# Patient Record
Sex: Female | Born: 2000 | Race: White | Hispanic: Yes | Marital: Single | Smoking: Never smoker
Health system: Southern US, Community
[De-identification: ages and names within clinical notes are randomized; demographics above are authoritative.]

---
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
## 2019-04-22 ENCOUNTER — Emergency Department
Admission: EM | Admit: 2019-04-22 | Discharge: 2019-04-23 | Disposition: A | Discharge status: Home / Self Care | Payer: Medicaid Other | Attending: Emergency Medicine | Admitting: Emergency Medicine

## 2019-04-22 ENCOUNTER — Other Ambulatory Visit: Payer: Self-pay

## 2019-04-22 ENCOUNTER — Emergency Department: Payer: Medicaid Other

## 2019-04-22 DIAGNOSIS — R1011 Right upper quadrant pain: Secondary | ICD-10-CM | POA: Diagnosis present

## 2019-04-22 DIAGNOSIS — R1013 Epigastric pain: Secondary | ICD-10-CM | POA: Insufficient documentation

## 2019-04-22 LAB — URINALYSIS, COMPLETE (UACMP) WITH MICROSCOPIC
Bilirubin Urine: NEGATIVE
Glucose, UA: NEGATIVE mg/dL
Hgb urine dipstick: NEGATIVE
Ketones, ur: NEGATIVE mg/dL
Leukocytes,Ua: NEGATIVE
Nitrite: NEGATIVE
Protein, ur: NEGATIVE mg/dL
Specific Gravity, Urine: 1.019 (ref 1.005–1.030)
WBC, UA: NONE SEEN WBC/hpf (ref 0–5)
pH: 6 (ref 5.0–8.0)

## 2019-04-22 LAB — COMPREHENSIVE METABOLIC PANEL
ALT: 19 U/L (ref 0–44)
AST: 13 U/L — ABNORMAL LOW (ref 15–41)
Albumin: 4.2 g/dL (ref 3.5–5.0)
Alkaline Phosphatase: 90 U/L (ref 38–126)
Anion gap: 5 (ref 5–15)
BUN: 8 mg/dL (ref 6–20)
CO2: 27 mmol/L (ref 22–32)
Calcium: 8.9 mg/dL (ref 8.9–10.3)
Chloride: 105 mmol/L (ref 98–111)
Creatinine, Ser: 0.53 mg/dL (ref 0.44–1.00)
GFR calc Af Amer: >60 mL/min (ref >60)
GFR calc non Af Amer: >60 mL/min (ref >60)
Glucose, Bld: 91 mg/dL (ref 70–99)
Potassium: 4.4 mmol/L (ref 3.5–5.1)
Sodium: 137 mmol/L (ref 135–145)
Total Bilirubin: 0.5 mg/dL (ref 0.3–1.2)
Total Protein: 7.2 g/dL (ref 6.5–8.1)

## 2019-04-22 LAB — CBC
HCT: 38.4 % (ref 36.0–46.0)
Hemoglobin: 12.3 g/dL (ref 12.0–15.0)
MCH: 29.1 pg (ref 26.0–34.0)
MCHC: 32 g/dL (ref 30.0–36.0)
MCV: 91 fL (ref 80.0–100.0)
Platelets: 291 10*3/uL (ref 150–400)
RBC: 4.22 MIL/uL (ref 3.87–5.11)
RDW: 12 % (ref 11.5–15.5)
WBC: 11.8 10*3/uL — ABNORMAL HIGH (ref 4.0–10.5)
nRBC: 0 % (ref 0.0–0.2)

## 2019-04-22 LAB — LIPASE, BLOOD: Lipase: 22 U/L (ref 11–51)

## 2019-04-22 LAB — POCT PREGNANCY, URINE: Preg Test, Ur: NEGATIVE

## 2019-04-22 MED ORDER — LIDOCAINE VISCOUS HCL 2 % MT SOLN
15.0000 mL | Freq: Once | OROMUCOSAL | Status: AC
  Administered 2019-04-22: 23:00:00 15 mL via ORAL
  Filled 2019-04-22: qty 15

## 2019-04-22 MED ORDER — ALUM & MAG HYDROXIDE-SIMETH 200-200-20 MG/5ML PO SUSP
30.0000 mL | Freq: Once | ORAL | Status: AC
  Administered 2019-04-22: 30 mL via ORAL
  Filled 2019-04-22: qty 30

## 2019-04-22 NOTE — ED Notes (Signed)
Patient reports epigastric pain with and increase when she eats. Patient reports pain in the area that is intermittent. Patient states she has experienced fever yesterday and has had diarrhea.

## 2019-04-22 NOTE — ED Triage Notes (Signed)
Pt in with co epigastric pain x 3 days, hx of reflux but states does not feel the same. Pt has had n.v.d since yesterday.

## 2019-04-22 NOTE — ED Provider Notes (Signed)
[facility] Emergency Department Provider Note  ____________________________________________   First MD Initiated Contact with Patient 04/22/19 2307     (approximate)  I have reviewed the triage vital signs and the nursing notes.   HISTORY  Chief Complaint Abdominal Pain   HPI Claire Schmitt is a 19 y.o. female with no known chronic medical conditions no previous surgeries presents to the emergency department secondary to current 6 out of 10 cramping epigastric pain x3 days.  Patient does admit that the pain is worse with food.  Patient does admit to nausea and 1 episode of vomiting yesterday.  Patient denies any diarrhea.        No past medical history on file.  Patient Active Problem List   Diagnosis Date Noted  . Migraine with aura and without status migrainosus, not intractable 06/07/2014  . Episodic tension type headache 06/07/2014  . Acanthosis nigricans 06/07/2014    No past surgical history on file.  Prior to Admission medications   Medication Sig Start Date End Date Taking? Authorizing Provider  pantoprazole (PROTONIX) 40 MG tablet Take 1 tablet (40 mg total) by mouth daily. 04/23/19 05/23/19  Darci Current, MD    Allergies Patient has no known allergies.  No family history on file.  Social History Social History   Tobacco Use  . Smoking status: Never Smoker  Substance Use Topics  . Alcohol use: No    Alcohol/week: 0.0 standard drinks  . Drug use: No    Review of Systems Constitutional: No fever/chills Eyes: No visual changes. ENT: No sore throat. Cardiovascular: Denies chest pain. Respiratory: Denies shortness of breath. Gastrointestinal: Positive for abdominal pain, nausea and vomiting no diarrhea.  No constipation. Genitourinary: Negative for dysuria. Musculoskeletal: Negative for neck pain.  Negative for back pain. Integumentary: Negative for rash. Neurological: Negative for headaches, focal weakness or  numbness.  ____________________________________________   PHYSICAL EXAM:  VITAL SIGNS: ED Triage Vitals  Enc Vitals Group     BP 04/22/19 2200 119/71     Pulse Rate 04/22/19 2200 79     Resp 04/22/19 2200 20     Temp 04/22/19 2200 98.3 F (36.8 C)     Temp Source 04/22/19 2200 Oral     SpO2 04/22/19 2200 100 %     Weight 04/22/19 2201 99.8 kg (220 lb)     Height 04/22/19 2201 1.626 m (5\' 4" )     Head Circumference --      Peak Flow --      Pain Score 04/22/19 2201 8     Pain Loc --      Pain Edu? --      Excl. in GC? --     Constitutional: Alert and oriented.  Eyes: Conjunctivae are normal.  Mouth/Throat: Patient is wearing a mask. Neck: No stridor.  No meningeal signs.   Cardiovascular: Normal rate, regular rhythm. Good peripheral circulation. Grossly normal heart sounds. Respiratory: Normal respiratory effort.  No retractions. Gastrointestinal: Soft and nontender. No distention.  Musculoskeletal: No lower extremity tenderness nor edema. No gross deformities of extremities. Neurologic:  Normal speech and language. No gross focal neurologic deficits are appreciated.  Skin:  Skin is warm, dry and intact. Psychiatric: Mood and affect are normal. Speech and behavior are normal. ____________________________________________   LABS (all labs ordered are listed, but only abnormal results are displayed)  Labs Reviewed  CBC - Abnormal; Notable for the following components:      Result Value   WBC  11.8 (*)    All other components within normal limits  COMPREHENSIVE METABOLIC PANEL - Abnormal; Notable for the following components:   AST 13 (*)    All other components within normal limits  URINALYSIS, COMPLETE (UACMP) WITH MICROSCOPIC - Abnormal; Notable for the following components:   Color, Urine YELLOW (*)    APPearance HAZY (*)    Bacteria, UA RARE (*)    All other components within normal limits  LIPASE, BLOOD  POC URINE PREG, ED  POCT PREGNANCY, URINE      RADIOLOGY I, [place] Claire Schmitt, personally viewed and evaluated these images (plain radiographs) as part of my medical decision making, as well as reviewing the written report by the radiologist.  ED MD interpretation:   Unremarkable right upper quadrant ultrasound per radiologist  Official radiology report(s): US ABDOMEN LIMITED RUQ  Result Date: 04/23/2019 CLINICAL DATA:  Right upper quadrant pain for 3 days EXAM: ULTRASOUND ABDOMEN LIMITED RIGHT UPPER QUADRANT COMPARISON:  None. FINDINGS: Gallbladder: No gallstones or wall thickening visualized. No sonographic Murphy sign noted by sonographer. Common bile duct: Diameter: 4 mm Liver: No focal lesion identified. Within normal limits in parenchymal echogenicity. Portal vein is patent on color Doppler imaging with normal direction of blood flow towards the liver. Other: None. IMPRESSION: 1. Unremarkable right upper quadrant ultrasound. Electronically Signed   By: Randa Ngo M.D.   On: 04/23/2019 00:24     Procedures   ____________________________________________   INITIAL IMPRESSION / MDM / [place] / ED COURSE  As part of my medical decision making, I reviewed the following data within the electronic MEDICAL RECORD NUMBER  19 year old female presented with above-stated history and physical exam a differential diagnosis including but not limited to peptic ulcer gastritis pancreatitis cholelithiasis versus other potential gallbladder pathology.  Right upper quadrant ultrasound performed normal.  Laboratory data unremarkable.  Patient given a GI cocktail with improvement of symptoms.  Patient be prescribed Protonix for home with recommendation to follow-up with gastroenterology Dr. Marius Ditch ____________________________________________  FINAL CLINICAL IMPRESSION(S) / ED DIAGNOSES  Final diagnoses:  RUQ pain  Epigastric pain     MEDICATIONS GIVEN DURING THIS VISIT:  Medications  alum & mag hydroxide-simeth (MAALOX/MYLANTA)  200-200-20 MG/5ML suspension 30 mL (30 mLs Oral Given 04/22/19 2327)    And  lidocaine (XYLOCAINE) 2 % viscous mouth solution 15 mL (15 mLs Oral Given 04/22/19 2327)     ED Discharge Orders         Ordered    pantoprazole (PROTONIX) 40 MG tablet  Daily     04/23/19 0023          *Please note:  Yancy Hascall was evaluated in Emergency Department on 04/23/2019 for the symptoms described in the history of present illness. She was evaluated in the context of the global COVID-19 pandemic, which necessitated consideration that the patient might be at risk for infection with the SARS-CoV-2 virus that causes COVID-19. Institutional protocols and algorithms that pertain to the evaluation of patients at risk for COVID-19 are in a state of rapid change based on information released by regulatory bodies including the CDC and federal and state organizations. These policies and algorithms were followed during the patient's care in the ED.  Some ED evaluations and interventions may be delayed as a result of limited staffing during the pandemic.*  Note:  This document was prepared using Dragon voice recognition software and may include unintentional dictation errors.   Gregor Hams, MD 04/23/19 [phone]

## 2019-04-23 MED ORDER — PANTOPRAZOLE SODIUM 40 MG PO TBEC: 40.0000 mg | DELAYED_RELEASE_TABLET | Freq: Every day | ORAL | 0 refills | Status: AC

## 2020-04-16 ENCOUNTER — Emergency Department: Payer: Medicaid Other

## 2020-04-16 ENCOUNTER — Other Ambulatory Visit: Payer: Self-pay

## 2020-04-16 ENCOUNTER — Inpatient Hospital Stay
Admission: EM | Admit: 2020-04-16 | Discharge: 2020-04-18 | DRG: 439 | Disposition: A | Discharge status: Home / Self Care | Payer: Medicaid Other | Attending: Internal Medicine | Admitting: Internal Medicine

## 2020-04-16 ENCOUNTER — Encounter: Payer: Self-pay | Admitting: Internal Medicine

## 2020-04-16 DIAGNOSIS — Z6841 Body Mass Index (BMI) 40.0 and over, adult: Secondary | ICD-10-CM | POA: Diagnosis not present

## 2020-04-16 DIAGNOSIS — Z8249 Family history of ischemic heart disease and other diseases of the circulatory system: Secondary | ICD-10-CM

## 2020-04-16 DIAGNOSIS — E66813 Obesity, class 3: Secondary | ICD-10-CM | POA: Diagnosis present

## 2020-04-16 DIAGNOSIS — Z20822 Contact with and (suspected) exposure to covid-19: Secondary | ICD-10-CM | POA: Diagnosis present

## 2020-04-16 DIAGNOSIS — K859 Acute pancreatitis without necrosis or infection, unspecified: Principal | ICD-10-CM | POA: Diagnosis present

## 2020-04-16 LAB — URINALYSIS, ROUTINE W REFLEX MICROSCOPIC
Bacteria, UA: NONE SEEN
Bilirubin Urine: NEGATIVE
Glucose, UA: NEGATIVE mg/dL
Ketones, ur: NEGATIVE mg/dL
Leukocytes,Ua: NEGATIVE
Nitrite: NEGATIVE
Protein, ur: NEGATIVE mg/dL
Specific Gravity, Urine: 1.02 (ref 1.005–1.030)
pH: 6 (ref 5.0–8.0)

## 2020-04-16 LAB — CBC
HCT: 40.6 % (ref 36.0–46.0)
Hemoglobin: 13.3 g/dL (ref 12.0–15.0)
MCH: 28.2 pg (ref 26.0–34.0)
MCHC: 32.8 g/dL (ref 30.0–36.0)
MCV: 86 fL (ref 80.0–100.0)
Platelets: 317 10*3/uL (ref 150–400)
RBC: 4.72 MIL/uL (ref 3.87–5.11)
RDW: 14 % (ref 11.5–15.5)
WBC: 10.1 10*3/uL (ref 4.0–10.5)
nRBC: 0 % (ref 0.0–0.2)

## 2020-04-16 LAB — BASIC METABOLIC PANEL
Anion gap: 9 (ref 5–15)
BUN: 16 mg/dL (ref 6–20)
CO2: 23 mmol/L (ref 22–32)
Calcium: 9 mg/dL (ref 8.9–10.3)
Chloride: 106 mmol/L (ref 98–111)
Creatinine, Ser: 0.63 mg/dL (ref 0.44–1.00)
GFR, Estimated: >60 mL/min (ref >60)
Glucose, Bld: 113 mg/dL — ABNORMAL HIGH (ref 70–99)
Potassium: 3.7 mmol/L (ref 3.5–5.1)
Sodium: 138 mmol/L (ref 135–145)

## 2020-04-16 LAB — LIPID PANEL
Cholesterol: 150 mg/dL (ref 0–200)
HDL: 35 mg/dL — ABNORMAL LOW (ref >40)
LDL Cholesterol: 101 mg/dL — ABNORMAL HIGH (ref 0–99)
Total CHOL/HDL Ratio: 4.3 RATIO
Triglycerides: 68 mg/dL (ref <150)
VLDL: 14 mg/dL (ref 0–40)

## 2020-04-16 LAB — TROPONIN I (HIGH SENSITIVITY): Troponin I (High Sensitivity): <2 ng/L (ref <18)

## 2020-04-16 LAB — HIV ANTIBODY (ROUTINE TESTING W REFLEX): HIV Screen 4th Generation wRfx: NONREACTIVE

## 2020-04-16 LAB — HEPATIC FUNCTION PANEL
ALT: 34 U/L (ref 0–44)
AST: 27 U/L (ref 15–41)
Albumin: 4.2 g/dL (ref 3.5–5.0)
Alkaline Phosphatase: 85 U/L (ref 38–126)
Bilirubin, Direct: 0.1 mg/dL (ref 0.0–0.2)
Indirect Bilirubin: 0.4 mg/dL (ref 0.3–0.9)
Total Bilirubin: 0.5 mg/dL (ref 0.3–1.2)
Total Protein: 7.2 g/dL (ref 6.5–8.1)

## 2020-04-16 LAB — POC URINE PREG, ED: Preg Test, Ur: NEGATIVE

## 2020-04-16 LAB — SARS CORONAVIRUS 2 (TAT 6-24 HRS): SARS Coronavirus 2: NEGATIVE

## 2020-04-16 LAB — LIPASE, BLOOD: Lipase: 689 U/L — ABNORMAL HIGH (ref 11–51)

## 2020-04-16 MED ORDER — SODIUM CHLORIDE 0.9 % IV SOLN: INTRAVENOUS | Status: DC

## 2020-04-16 MED ORDER — LACTATED RINGERS IV BOLUS
1000.0000 mL | Freq: Once | INTRAVENOUS | Status: AC
  Administered 2020-04-16: 1000 mL via INTRAVENOUS

## 2020-04-16 MED ORDER — IOHEXOL 300 MG/ML  SOLN
100.0000 mL | Freq: Once | INTRAMUSCULAR | Status: AC | PRN
  Administered 2020-04-16: 100 mL via INTRAVENOUS

## 2020-04-16 MED ORDER — ONDANSETRON HCL 4 MG/2ML IJ SOLN: 4.0000 mg | Freq: Four times a day (QID) | INTRAMUSCULAR | Status: DC | PRN

## 2020-04-16 MED ORDER — ONDANSETRON HCL 4 MG/2ML IJ SOLN
4.0000 mg | INTRAMUSCULAR | Status: AC
  Administered 2020-04-16: 4 mg via INTRAVENOUS
  Filled 2020-04-16: qty 2

## 2020-04-16 MED ORDER — ONDANSETRON HCL 4 MG PO TABS: 4.0000 mg | ORAL_TABLET | Freq: Four times a day (QID) | ORAL | Status: DC | PRN

## 2020-04-16 MED ORDER — MORPHINE SULFATE (PF) 2 MG/ML IV SOLN
2.0000 mg | INTRAVENOUS | Status: DC | PRN
Start: 2020-04-16 — End: 2020-04-16
  Administered 2020-04-16: 2 mg via INTRAVENOUS
  Filled 2020-04-16: qty 1

## 2020-04-16 MED ORDER — MORPHINE SULFATE (PF) 2 MG/ML IV SOLN
2.0000 mg | INTRAVENOUS | Status: DC | PRN
  Administered 2020-04-16 – 2020-04-18 (×7): 2 mg via INTRAVENOUS
  Filled 2020-04-16 (×7): qty 1

## 2020-04-16 MED ORDER — PANTOPRAZOLE SODIUM 40 MG IV SOLR
40.0000 mg | INTRAVENOUS | Status: DC
  Administered 2020-04-16 – 2020-04-18 (×3): 40 mg via INTRAVENOUS
  Filled 2020-04-16 (×3): qty 40

## 2020-04-16 MED ORDER — KETOROLAC TROMETHAMINE 30 MG/ML IJ SOLN
15.0000 mg | Freq: Once | INTRAMUSCULAR | Status: AC
  Administered 2020-04-16: 15 mg via INTRAVENOUS
  Filled 2020-04-16: qty 1

## 2020-04-16 MED ORDER — MORPHINE SULFATE (PF) 4 MG/ML IV SOLN
4.0000 mg | Freq: Once | INTRAVENOUS | Status: AC
  Administered 2020-04-16: 4 mg via INTRAVENOUS
  Filled 2020-04-16: qty 1

## 2020-04-16 MED ORDER — MORPHINE SULFATE (PF) 2 MG/ML IV SOLN
2.0000 mg | Freq: Once | INTRAVENOUS | Status: AC
  Administered 2020-04-16: 2 mg via INTRAVENOUS
  Filled 2020-04-16: qty 1

## 2020-04-16 MED ORDER — ENOXAPARIN SODIUM 40 MG/0.4ML ~~LOC~~ SOLN
40.0000 mg | SUBCUTANEOUS | Status: DC
  Administered 2020-04-16 – 2020-04-17 (×3): 40 mg via SUBCUTANEOUS
  Filled 2020-04-16 (×3): qty 0.4

## 2020-04-16 NOTE — ED Provider Notes (Signed)
[facility] Emergency Department Provider Note  ____________________________________________   Event Date/Time   First MD Initiated Contact with Patient 04/16/20 [phone]     (approximate)  I have reviewed the triage vital signs and the nursing notes.   HISTORY  Chief Complaint Abdominal Pain    HPI Claire Schmitt is a 20 y.o. female with no contributory past medical history who presents for evaluation of cute onset and severe  pain in her upper abdomen that radiates through to the middle of her back. It started last night and has been going on for hours. She has had nausea and vomiting as well. Trying to eat or drink anything makes it worse. She has had a similar issue once or twice in the past but it was much more mild and tonight is severe. She has not had any lower abdominal pain. She denies fever, sore throat, chest pain, shortness of breath.  The patient denies regular alcohol use and has never had her cholesterol or triglycerides checked. No new medications. No known history of gallbladder disease but she has not had a cholecystectomy.        No past medical history on file.  Patient Active Problem List   Diagnosis Date Noted  . Acute pancreatitis 04/16/2020  . Migraine with aura and without status migrainosus, not intractable 06/07/2014  . Episodic tension type headache 06/07/2014  . Acanthosis nigricans 06/07/2014    No past surgical history on file.  Prior to Admission medications   Medication Sig Start Date End Date Taking? Authorizing Provider  pantoprazole (PROTONIX) 40 MG tablet Take 1 tablet (40 mg total) by mouth daily. 04/23/19 05/23/19  Darci Current, MD    Allergies Patient has no known allergies.  No family history on file.  Social History Social History   Tobacco Use  . Smoking status: Never Smoker  Substance Use Topics  . Alcohol use: No    Alcohol/week: 0.0 standard drinks  . Drug use: No    Review of  Systems Constitutional: No fever/chills Eyes: No visual changes. ENT: No sore throat. Cardiovascular: Denies chest pain. Respiratory: Denies shortness of breath. Gastrointestinal: Upper abdominal pain associated with nausea and vomiting. Genitourinary: Negative for dysuria. Musculoskeletal: Negative for neck pain.  Negative for back pain. Integumentary: Negative for rash. Neurological: Negative for headaches, focal weakness or numbness.   ____________________________________________   PHYSICAL EXAM:  VITAL SIGNS: ED Triage Vitals [04/16/20 0357]  Enc Vitals Group     BP 130/74     Pulse Rate 77     Resp 16     Temp 97.8 F (36.6 C)     Temp Source Oral     SpO2 100 %     Weight 117.9 kg (260 lb)     Height 1.626 m (5\' 4" )     Head Circumference      Peak Flow      Pain Score 9     Pain Loc      Pain Edu?      Excl. in GC?     Constitutional: Alert and oriented. Appears uncomfortable Eyes: Conjunctivae are normal.  Head: Atraumatic. Nose: No congestion/rhinnorhea. Mouth/Throat: Patient is wearing a mask. Neck: No stridor.  No meningeal signs.   Cardiovascular: Normal rate, regular rhythm. Good peripheral circulation. Respiratory: Normal respiratory effort.  No retractions. Gastrointestinal: Obese. Severe tenderness palpation of the epigastrium and right upper quadrant with positive Murphy sign. No lower abdominal tenderness. Musculoskeletal: No lower extremity tenderness  nor edema. No gross deformities of extremities. Neurologic:  Normal speech and language. No gross focal neurologic deficits are appreciated.  Skin:  Skin is warm, dry and intact. Psychiatric: Mood and affect are normal. Speech and behavior are normal.  ____________________________________________   LABS (all labs ordered are listed, but only abnormal results are displayed)  Labs Reviewed  BASIC METABOLIC PANEL - Abnormal; Notable for the following components:      Result Value   Glucose, Bld  113 (*)    All other components within normal limits  LIPASE, BLOOD - Abnormal; Notable for the following components:   Lipase 689 (*)    All other components within normal limits  LIPID PANEL - Abnormal; Notable for the following components:   HDL 35 (*)    LDL Cholesterol 101 (*)    All other components within normal limits  URINALYSIS, ROUTINE W REFLEX MICROSCOPIC - Abnormal; Notable for the following components:   Color, Urine YELLOW (*)    APPearance HAZY (*)    Hgb urine dipstick SMALL (*)    All other components within normal limits  POC URINE PREG, ED - Normal  SARS CORONAVIRUS 2 (TAT 6-24 HRS)  CBC  HEPATIC FUNCTION PANEL  HIV ANTIBODY (ROUTINE TESTING W REFLEX)  TROPONIN I (HIGH SENSITIVITY)   ____________________________________________  EKG  ED ECG REPORT I, Loleta Roseory Forbach, the attending physician, personally viewed and interpreted this ECG.  Date: 04/16/2020 EKG Time: 3:50 AM Rate: 74 Rhythm: normal sinus rhythm QRS Axis: normal Intervals: normal ST/T Wave abnormalities: normal Narrative Interpretation: no evidence of acute ischemia ____________________________________________  RADIOLOGY I, Loleta Roseory Forbach, personally viewed and evaluated these images (plain radiographs) as part of my medical decision making, as well as reviewing the written report by the radiologist.  ED MD interpretation: Gallbladder sludge but no evidence of cholecystitis and no suggestion of choledocholithiasis on ultrasound. CT scan with IV contrast of the abdomen pelvis demonstrates uncomplicated pancreatitis.  Official radiology report(s): CT ABDOMEN PELVIS W CONTRAST  Result Date: 04/16/2020 CLINICAL DATA:  Epigastric pain. EXAM: CT ABDOMEN AND PELVIS WITH CONTRAST TECHNIQUE: Multidetector CT imaging of the abdomen and pelvis was performed using the standard protocol following bolus administration of intravenous contrast. CONTRAST:  100mL OMNIPAQUE IOHEXOL 300 MG/ML  SOLN COMPARISON:   Ultrasound from earlier today FINDINGS: Lower chest: No acute finding.  Motion artifact. Hepatobiliary: No focal liver abnormality.Preceding right upper quadrant ultrasound. Upper common bile duct dimensions are obscured by fat edema. At the level of the head the duct measures 5 mm. No calcified choledocholithiasis. Pancreas: Generalized expansion with septal and peripancreatic edema. No necrosis or collection. No visible vascular compromise. Spleen: Unremarkable. Adrenals/Urinary Tract: Negative adrenals. No hydronephrosis or stone. Unremarkable bladder. Stomach/Bowel:  No obstruction. No appendicitis. Vascular/Lymphatic: No acute vascular abnormality. No mass or adenopathy. Reproductive:No pathologic findings. Evidence of prior Cesarian section. Other: No ascites or pneumoperitoneum. Musculoskeletal: No acute abnormalities. IMPRESSION: Acute edematous pancreatitis without collection. Electronically Signed   By: Marnee SpringJonathon  Watts M.D.   On: 04/16/2020 06:53   US ABDOMEN LIMITED RUQ (LIVER/GB)  Result Date: 04/16/2020 CLINICAL DATA:  Epigastric pain EXAM: ULTRASOUND ABDOMEN LIMITED RIGHT UPPER QUADRANT COMPARISON:  None. FINDINGS: Gallbladder: Sludge noted layering within the fundal portion of the gallbladder. No gallstones or wall thickening visualized. No sonographic Murphy sign noted by sonographer. Common bile duct: Diameter: 6 mm Liver: Increased parenchymal echogenicity suggestive of hepatic steatosis. No focal lesion. Portal vein is patent on color Doppler imaging with normal direction of blood flow towards  the liver. Other: None. IMPRESSION: 1. No acute findings. 2. Gallbladder sludge. No gallstones, gallbladder wall thickening or sonographic Murphy's sign. 3. Common bile duct is upper limits of normal in caliber measuring 6 mm. Electronically Signed   By: Signa Kell M.D.   On: 04/16/2020 05:50    ____________________________________________   PROCEDURES   Procedure(s) performed (including  Critical Care):  Procedures   ____________________________________________   INITIAL IMPRESSION / MDM / ASSESSMENT AND PLAN / ED COURSE  As part of my medical decision making, I reviewed the following data within the electronic MEDICAL RECORD NUMBER Nursing notes reviewed and incorporated, Labs reviewed , EKG interpreted , Old chart reviewed, Discussed with admitting physician  and Notes from prior ED visits   Differential diagnosis includes, but is not limited to, biliary colic, pancreatitis, acid reflux.  The patient most likely suffering from biliary colic. Lab work is pending and abdominal ultrasound is pending. CBC is normal with no leukocytosis. Patient is afebrile and vital signs are stable. I ordered morphine 4 mg IV, Toradol 15 mg IV, and Zofran 4 mg IV for pain and nausea.       Clinical Course as of 04/16/20 9509  Wynelle Link Apr 16, 2020  0455 Lipase(!): 689 Consistent with acute pancreatitis, awaiting LFTs and U/S results. [CF]  [id number] Hepatic function panel Hepatic function panel is normal with no transaminitis. [CF]  0602 Right upper quadrant ultrasound demonstrates gallbladder sludge but no obvious or acute abnormalities such as cholecystitis.  I reassessed the patient and she is feeling better but still has abdominal pain.  She has no history of alcohol use and does not think she has ever been tested for cholesterol/triglycerides.  Given that this is somewhat of an unusual presentation with no clear cause, I am proceeding with CT scan of the abdomen pelvis with IV contrast.  She has implanted birth control but we will attempt to get a urinalysis and verify negative pregnancy test.  I ordered lactated Ringer's 1 L IV bolus and morphine 4 mg IV for her persistent pain.  She understands that she will likely be admitted to the hospital for further management and to allow her pancreas to "cool down" before she can proceed with outpatient, although if she is feeling significantly better and  the CT scan is reassuring she may try outpatient management.  I also added on a lipid panel. [CF]  [id number] Preg Test, Ur: Negative [CF]  0709 Patient said her pain has improved after morphine 4 mg IV.  However she is still severely tender to palpation upon abdominal reassessment.  Given her symptoms, her CT scan which shows acute edematous pancreatitis, and her degree of discomfort, I think it is unlikely she would be successful with outpatient management.  She agrees and would rather stay in the hospital.  I ordered a hospitalist consult. [CF]    Clinical Course User Index [CF] Loleta Rose, MD     ____________________________________________  FINAL CLINICAL IMPRESSION(S) / ED DIAGNOSES  Final diagnoses:  Acute pancreatitis, unspecified complication status, unspecified pancreatitis type     MEDICATIONS GIVEN DURING THIS VISIT:  Medications  enoxaparin (LOVENOX) injection 40 mg (has no administration in time range)  0.9 %  sodium chloride infusion ( Intravenous New Bag/Given 04/16/20 0750)  morphine 2 MG/ML injection 2 mg (has no administration in time range)  ondansetron (ZOFRAN) tablet 4 mg (has no administration in time range)    Or  ondansetron (ZOFRAN) injection 4 mg (has no administration in time range)  pantoprazole (PROTONIX) injection 40 mg (has no administration in time range)  ketorolac (TORADOL) 30 MG/ML injection 15 mg (15 mg Intravenous Given 04/16/20 0505)  ondansetron (ZOFRAN) injection 4 mg (4 mg Intravenous Given 04/16/20 0505)  lactated ringers bolus 1,000 mL (1,000 mLs Intravenous New Bag/Given 04/16/20 0618)  morphine 4 MG/ML injection 4 mg (4 mg Intravenous Given 04/16/20 0617)  iohexol (OMNIPAQUE) 300 MG/ML solution 100 mL (100 mLs Intravenous Contrast Given 04/16/20 7425)     ED Discharge Orders    None      *Please note:  Siah Kannan was evaluated in Emergency Department on 04/16/2020 for the symptoms described in the history of present illness. She was  evaluated in the context of the global COVID-19 pandemic, which necessitated consideration that the patient might be at risk for infection with the SARS-CoV-2 virus that causes COVID-19. Institutional protocols and algorithms that pertain to the evaluation of patients at risk for COVID-19 are in a state of rapid change based on information released by regulatory bodies including the CDC and federal and state organizations. These policies and algorithms were followed during the patient's care in the ED.  Some ED evaluations and interventions may be delayed as a result of limited staffing during and after the pandemic.*  Note:  This document was prepared using Dragon voice recognition software and may include unintentional dictation errors.   Loleta Rose, MD 04/16/20 [phone]

## 2020-04-16 NOTE — Plan of Care (Signed)

## 2020-04-16 NOTE — ED Notes (Signed)
LIPID PANEL BEING RAN BY LAB AT THIS TIME.

## 2020-04-16 NOTE — ED Notes (Signed)
ASSUMED CARE OF PT AT THIS TIME. NAD.  

## 2020-04-16 NOTE — ED Triage Notes (Signed)
Pt states upper abd pain radiating to central chest and mid back since 2200 last pm with vomiting. Pt denies diarrhea, shob or known fever. Pt appears uncomfortable.

## 2020-04-16 NOTE — H&P (Signed)
History and Physical    Claire Schmitt CBJ:[id number] DOB: 12/28/2000 DOA: 04/16/2020  PCP: [facility]   Patient coming from: Home  I have personally briefly reviewed patient's old medical records in [facility]  Chief Complaint: Abdominal pain  HPI: Claire Schmitt is a 20 y.o. female with no significant past medical history is also the ER for evaluation of sudden onset abdominal pain which started in the epigastrium and right upper quadrant with radiation to her back.  She rated her pain a 10 x 10 in intensity at its worst.  Pain was associated with nausea and vomiting and was aggravated by any form of oral intake.  Patient has had a similar issue in the past but not as severe as it was today. She denies having any fever or chills, no chest pain, no palpitations, no diaphoresis, no urinary frequency, no nocturia, no dysuria, no headache, no cough, no sore throat, no blurred vision, no weakness She does not have a known history of gallbladder disease and has not been on any new medications.  She denies history of alcohol use. Labs show sodium 138, potassium 3.7, chloride 106, bicarb 23, glucose 113, BUN 16, creatinine 0.63, calcium 9.0, alkaline phosphatase 85, albumin 4.2, lipase 689, AST 27, ALT 34, total protein 7.2, total cholesterol 158, HDL 35, LDL 101, triglycerides 68, white count 10.1, hemoglobin 13.3, hematocrit 40.6, MCV 86, RDW 14.0, platelet count 317 CT scan of abdomen and pelvis shows acute edematous pancreatitis without collection. Gallbladder ultrasound shows no acute findings. Gallbladder sludge. No gallstones, gallbladder wall thickening or sonographic Murphy's sign. Common bile duct is upper limits of normal in caliber measuring 6 mm.    ED Course: Patient is a 20 year old female who presents to the ER for evaluation of pain in the epigastrium and right upper quadrant associated with nausea and vomiting.  Labs show elevated lipase level and  imaging is consistent with acute pancreatitis.  Patient will be admitted to the hospital for further evaluation and treatment.  Review of Systems: As per HPI otherwise all other systems reviewed and negative.    No past medical history on file.  No past surgical history on file.   reports that she has never smoked. She does not have any smokeless tobacco history on file. She reports that she does not drink alcohol and does not use drugs.  No Known Allergies  Family History  Problem Relation Age of Onset  . Hypertension Mother       Prior to Admission medications   Medication Sig Start Date End Date Taking? Authorizing Provider  pantoprazole (PROTONIX) 40 MG tablet Take 1 tablet (40 mg total) by mouth daily. 04/23/19 05/23/19  Darci Current, MD    Physical Exam: Vitals:   04/16/20 0357 04/16/20 0631 04/16/20 0900  BP: 130/74 115/76 (!) 112/58  Pulse: 77 84 66  Resp: 16 16 16   Temp: 97.8 F (36.6 C)  98.1 F (36.7 C)  TempSrc: Oral  Oral  SpO2: 100% 98% 99%  Weight: 117.9 kg    Height: 5\' 4"  (1.626 m)       Vitals:   04/16/20 0357 04/16/20 0631 04/16/20 0900  BP: 130/74 115/76 (!) 112/58  Pulse: 77 84 66  Resp: 16 16 16   Temp: 97.8 F (36.6 C)  98.1 F (36.7 C)  TempSrc: Oral  Oral  SpO2: 100% 98% 99%  Weight: 117.9 kg    Height: 5\' 4"  (1.626 m)  Constitutional: Alert and oriented x 3 . Not in any apparent distress.  Morbidly obese HEENT:      Head: Normocephalic and atraumatic.         Eyes: PERLA, EOMI, Conjunctivae are normal. Sclera is non-icteric.       Mouth/Throat: Mucous membranes are moist.       Neck: Supple with no signs of meningismus. Cardiovascular: Regular rate and rhythm. No murmurs, gallops, or rubs. 2+ symmetrical distal pulses are present . No JVD. No LE edema Respiratory: Respiratory effort normal .Lungs sounds clear bilaterally. No wheezes, crackles, or rhonchi.  Gastrointestinal: Soft, epigastric/right upper quadrant  tenderness, and non distended with positive bowel sounds.  Genitourinary: No CVA tenderness. Musculoskeletal: Nontender with normal range of motion in all extremities. No cyanosis, or erythema of extremities. Neurologic:  Face is symmetric. Moving all extremities. No gross focal neurologic deficits  Skin: Skin is warm, dry.  No rash or ulcers Psychiatric: Mood and affect are normal   Labs on Admission: I have personally reviewed following labs and imaging studies  CBC: Recent Labs  Lab 04/16/20 0405  WBC 10.1  HGB 13.3  HCT 40.6  MCV 86.0  PLT 317   Basic Metabolic Panel: Recent Labs  Lab 04/16/20 0405  NA 138  K 3.7  CL 106  CO2 23  GLUCOSE 113*  BUN 16  CREATININE 0.63  CALCIUM 9.0   GFR: Estimated Creatinine Clearance: 141.7 mL/min (by C-G formula based on SCr of 0.63 mg/dL). Liver Function Tests: Recent Labs  Lab 04/16/20 0405  AST 27  ALT 34  ALKPHOS 85  BILITOT 0.5  PROT 7.2  ALBUMIN 4.2   Recent Labs  Lab 04/16/20 0405  LIPASE 689*   No results for input(s): AMMONIA in the last 168 hours. Coagulation Profile: No results for input(s): INR, PROTIME in the last 168 hours. Cardiac Enzymes: No results for input(s): CKTOTAL, CKMB, CKMBINDEX, TROPONINI in the last 168 hours. BNP (last 3 results) No results for input(s): PROBNP in the last 8760 hours. HbA1C: No results for input(s): HGBA1C in the last 72 hours. CBG: No results for input(s): GLUCAP in the last 168 hours. Lipid Profile: Recent Labs    04/16/20 0405  CHOL 150  HDL 35*  LDLCALC 101*  TRIG 68  CHOLHDL 4.3   Thyroid Function Tests: No results for input(s): TSH, T4TOTAL, FREET4, T3FREE, THYROIDAB in the last 72 hours. Anemia Panel: No results for input(s): VITAMINB12, FOLATE, FERRITIN, TIBC, IRON, RETICCTPCT in the last 72 hours. Urine analysis:    Component Value Date/Time   COLORURINE YELLOW (A) 04/16/2020 0612   APPEARANCEUR HAZY (A) 04/16/2020 0612   LABSPEC 1.020  04/16/2020 0612   PHURINE 6.0 04/16/2020 0612   GLUCOSEU NEGATIVE 04/16/2020 0612   HGBUR SMALL (A) 04/16/2020 0612   BILIRUBINUR NEGATIVE 04/16/2020 0612   KETONESUR NEGATIVE 04/16/2020 0612   PROTEINUR NEGATIVE 04/16/2020 0612   NITRITE NEGATIVE 04/16/2020 0612   LEUKOCYTESUR NEGATIVE 04/16/2020 0612    Radiological Exams on Admission: CT ABDOMEN PELVIS W CONTRAST  Result Date: 04/16/2020 CLINICAL DATA:  Epigastric pain. EXAM: CT ABDOMEN AND PELVIS WITH CONTRAST TECHNIQUE: Multidetector CT imaging of the abdomen and pelvis was performed using the standard protocol following bolus administration of intravenous contrast. CONTRAST:  OMNIPAQUE IOHEXOL 300 MG/ML  SOLN COMPARISON:  Ultrasound from earlier today FINDINGS: Lower chest: No acute finding.  Motion artifact. Hepatobiliary: No focal liver abnormality.Preceding right upper quadrant ultrasound. Upper common bile duct dimensions are obscured by fat edema.  At the level of the head the duct measures 5 mm. No calcified choledocholithiasis. Pancreas: Generalized expansion with septal and peripancreatic edema. No necrosis or collection. No visible vascular compromise. Spleen: Unremarkable. Adrenals/Urinary Tract: Negative adrenals. No hydronephrosis or stone. Unremarkable bladder. Stomach/Bowel:  No obstruction. No appendicitis. Vascular/Lymphatic: No acute vascular abnormality. No mass or adenopathy. Reproductive:No pathologic findings. Evidence of prior Cesarian section. Other: No ascites or pneumoperitoneum. Musculoskeletal: No acute abnormalities. IMPRESSION: Acute edematous pancreatitis without collection. Electronically Signed   By: Marnee Spring M.D.   On: 04/16/2020 06:53   US ABDOMEN LIMITED RUQ (LIVER/GB)  Result Date: 04/16/2020 CLINICAL DATA:  Epigastric pain EXAM: ULTRASOUND ABDOMEN LIMITED RIGHT UPPER QUADRANT COMPARISON:  None. FINDINGS: Gallbladder: Sludge noted layering within the fundal portion of the gallbladder. No  gallstones or wall thickening visualized. No sonographic Murphy sign noted by sonographer. Common bile duct: Diameter: 6 mm Liver: Increased parenchymal echogenicity suggestive of hepatic steatosis. No focal lesion. Portal vein is patent on color Doppler imaging with normal direction of blood flow towards the liver. Other: None. IMPRESSION: 1. No acute findings. 2. Gallbladder sludge. No gallstones, gallbladder wall thickening or sonographic Murphy's sign. 3. Common bile duct is upper limits of normal in caliber measuring 6 mm. Electronically Signed   By: Signa Kell M.D.   On: 04/16/2020 05:50     Assessment/Plan Principal Problem:   Acute pancreatitis Active Problems:   Obesity, Class III, BMI 40-49.9 (morbid obesity) (HCC)     Acute pancreatitis Unclear etiology probably idiopathic Patient denies history of alcohol use and is not on any medication. Lipid panel is within normal limits or does not show any evidence of hypertriglyceridemia Gallbladder ultrasound does not show any evidence of gallstones Supportive care N.p.o. for now, IV fluid hydration, pain control and antiemetics    Morbid obesity Complicates overall prognosis and care  DVT prophylaxis: Lovenox  Code Status: full code Family Communication: Greater than 50% of time was spent discussing patient's condition and plan of care with her at the bedside.  All questions and concerns have been addressed.  She verbalizes understanding and agrees with the plan. Disposition Plan: Back to previous home environment Consults called: none Status: Inpatient.  The medical decision making for this patient was of high complexity and patient is at high risk for clinical deterioration during this hospitalization.    Lucile Shutters MD [facility]     04/16/2020, 9:31 AM

## 2020-04-17 DIAGNOSIS — K859 Acute pancreatitis without necrosis or infection, unspecified: Principal | ICD-10-CM

## 2020-04-17 LAB — CBC
HCT: 36.4 % (ref 36.0–46.0)
Hemoglobin: 11.6 g/dL — ABNORMAL LOW (ref 12.0–15.0)
MCH: 27.9 pg (ref 26.0–34.0)
MCHC: 31.9 g/dL (ref 30.0–36.0)
MCV: 87.5 fL (ref 80.0–100.0)
Platelets: 285 10*3/uL (ref 150–400)
RBC: 4.16 MIL/uL (ref 3.87–5.11)
RDW: 14.5 % (ref 11.5–15.5)
WBC: 11.2 10*3/uL — ABNORMAL HIGH (ref 4.0–10.5)
nRBC: 0 % (ref 0.0–0.2)

## 2020-04-17 LAB — BASIC METABOLIC PANEL
Anion gap: 7 (ref 5–15)
BUN: 10 mg/dL (ref 6–20)
CO2: 23 mmol/L (ref 22–32)
Calcium: 8.4 mg/dL — ABNORMAL LOW (ref 8.9–10.3)
Chloride: 107 mmol/L (ref 98–111)
Creatinine, Ser: 0.59 mg/dL (ref 0.44–1.00)
GFR, Estimated: >60 mL/min (ref >60)
Glucose, Bld: 87 mg/dL (ref 70–99)
Potassium: 3.7 mmol/L (ref 3.5–5.1)
Sodium: 137 mmol/L (ref 135–145)

## 2020-04-17 MED ORDER — METOPROLOL TARTRATE 5 MG/5ML IV SOLN: 5.0000 mg | INTRAVENOUS | Status: DC | PRN

## 2020-04-17 MED ORDER — OXYCODONE HCL 5 MG PO TABS: 5.0000 mg | ORAL_TABLET | ORAL | Status: DC | PRN

## 2020-04-17 MED ORDER — DM-GUAIFENESIN ER 30-600 MG PO TB12: 1.0000 | ORAL_TABLET | Freq: Two times a day (BID) | ORAL | Status: DC | PRN

## 2020-04-17 MED ORDER — SENNOSIDES-DOCUSATE SODIUM 8.6-50 MG PO TABS: 1.0000 | ORAL_TABLET | Freq: Every evening | ORAL | Status: DC | PRN

## 2020-04-17 MED ORDER — HYDROCORTISONE 1 % EX CREA
1.0000 "application " | TOPICAL_CREAM | Freq: Three times a day (TID) | CUTANEOUS | Status: DC | PRN
  Filled 2020-04-17: qty 28

## 2020-04-17 MED ORDER — PHENOL 1.4 % MT LIQD
1.0000 | OROMUCOSAL | Status: DC | PRN
  Filled 2020-04-17: qty 177

## 2020-04-17 MED ORDER — ALUM & MAG HYDROXIDE-SIMETH 200-200-20 MG/5ML PO SUSP: 30.0000 mL | ORAL | Status: DC | PRN

## 2020-04-17 MED ORDER — POLYVINYL ALCOHOL 1.4 % OP SOLN
1.0000 [drp] | OPHTHALMIC | Status: DC | PRN
  Filled 2020-04-17: qty 15

## 2020-04-17 MED ORDER — ACETAMINOPHEN 325 MG PO TABS
650.0000 mg | ORAL_TABLET | Freq: Four times a day (QID) | ORAL | Status: DC | PRN
  Administered 2020-04-18: 650 mg via ORAL
  Filled 2020-04-17: qty 2

## 2020-04-17 MED ORDER — SALINE SPRAY 0.65 % NA SOLN
1.0000 | NASAL | Status: DC | PRN
  Filled 2020-04-17: qty 44

## 2020-04-17 MED ORDER — LACTATED RINGERS IV SOLN: INTRAVENOUS | Status: AC

## 2020-04-17 MED ORDER — HYDRALAZINE HCL 20 MG/ML IJ SOLN: 10.0000 mg | INTRAMUSCULAR | Status: DC | PRN

## 2020-04-17 MED ORDER — BLISTEX MEDICATED EX OINT
1.0000 "application " | TOPICAL_OINTMENT | CUTANEOUS | Status: DC | PRN
  Filled 2020-04-17: qty 6.3

## 2020-04-17 MED ORDER — LACTATED RINGERS IV BOLUS
1000.0000 mL | Freq: Once | INTRAVENOUS | Status: AC
  Administered 2020-04-17: 08:00:00 1000 mL via INTRAVENOUS

## 2020-04-17 MED ORDER — LORATADINE 10 MG PO TABS: 10.0000 mg | ORAL_TABLET | Freq: Every day | ORAL | Status: DC | PRN

## 2020-04-17 MED ORDER — HYDROCORTISONE (PERIANAL) 2.5 % EX CREA
1.0000 "application " | TOPICAL_CREAM | Freq: Four times a day (QID) | CUTANEOUS | Status: DC | PRN
  Filled 2020-04-17: qty 28.35

## 2020-04-17 MED ORDER — ENOXAPARIN SODIUM 60 MG/0.6ML ~~LOC~~ SOLN: 0.5000 mg/kg | SUBCUTANEOUS | Status: DC

## 2020-04-17 MED ORDER — MUSCLE RUB 10-15 % EX CREA
1.0000 "application " | TOPICAL_CREAM | CUTANEOUS | Status: DC | PRN
  Filled 2020-04-17: qty 85

## 2020-04-17 MED ORDER — POTASSIUM CHLORIDE 10 MEQ/100ML IV SOLN
10.0000 meq | INTRAVENOUS | Status: AC
Start: 2020-04-17 — End: 2020-04-17
  Administered 2020-04-17 (×3): 10 meq via INTRAVENOUS
  Filled 2020-04-17 (×3): qty 100

## 2020-04-17 NOTE — Progress Notes (Signed)
PROGRESS NOTE    Claire Schmitt  ASN:[id number] DOB: 17-Jun-2000 DOA: 04/16/2020 PCP: [facility]   Brief Narrative:  20 year old female with no known past medical history comes to the hospital with complaints of acute onset of abdominal pain along with nausea vomiting.  She was diagnosed with acute noncomplicated pancreatitis with elevated lipase of 689.  CT abdomen pelvis confirmed acute pancreatitis.  Right upper quadrant ultrasound was unremarkable.   Assessment & Plan:   Principal Problem:   Acute pancreatitis Active Problems:   Obesity, Class III, BMI 40-49.9 (morbid obesity) (HCC)   Acute uncomplicated pancreatitis -Unclear etiology at this time.  Could be autoimmune.  Overall LFTs are unremarkable.  Right upper quadrant ultrasound is negative.  LDL 101, triglycerides 68 -Advance diet as tolerated.  Currently n.p.o. -Lactated Ringer's IV fluids, pain control, bowel regimen   DVT prophylaxis: Lovenox Code Status: Full code Family Communication: Family at bedside sleeping  Status is: Inpatient  Remains inpatient appropriate because:IV treatments appropriate due to intensity of illness or inability to take PO   Dispo: The patient is from: Home              Anticipated d/c is to: Home              Anticipated d/c date is: 1 day              Patient currently is not medically stable to d/c.  Currently she feels nauseous and not tolerating oral diet requiring IV fluids.  Hopefully we can slowly advance her diet today give her IV fluids, antiemetics.  Discharge tomorrow.   Difficult to place patient No       Body mass index is 43.77 kg/m.       Subjective: Patient still having some nausea.  Abdominal pain is improved compared to yesterday.  Denies any alcohol, tobacco use.  Denies having any social history ER previous episodes of pancreatitis.  Review of Systems Otherwise negative except as per HPI, including: General: Denies fever, chills,  night sweats or unintended weight loss. Resp: Denies cough, wheezing, shortness of breath. Cardiac: Denies chest pain, palpitations, orthopnea, paroxysmal nocturnal dyspnea. GI: Denies, diarrhea or constipation GU: Denies dysuria, frequency, hesitancy or incontinence MS: Denies muscle aches, joint pain or swelling Neuro: Denies headache, neurologic deficits (focal weakness, numbness, tingling), abnormal gait Psych: Denies anxiety, depression, SI/HI/AVH Skin: Denies new rashes or lesions ID: Denies sick contacts, exotic exposures, travel  Examination:  General exam: Appears calm and comfortable  Respiratory system: Clear to auscultation. Respiratory effort normal. Cardiovascular system: S1 & S2 heard, RRR. No JVD, murmurs, rubs, gallops or clicks. No pedal edema. Gastrointestinal system: Abdomen is nondistended, soft and nontender. No organomegaly or masses felt. Normal bowel sounds heard. Central nervous system: Alert and oriented. No focal neurological deficits. Extremities: Symmetric 5 x 5 power. Skin: No rashes, lesions or ulcers Psychiatry: Judgement and insight appear normal. Mood & affect appropriate.     Objective: Vitals:   04/16/20 1605 04/16/20 2100 04/17/20 0045 04/17/20 0500  BP: 110/64 117/66 105/66 107/69  Pulse: 90 85 88 87  Resp: 16 16 20 19   Temp: 99.1 F (37.3 C) 99.6 F (37.6 C) 98.4 F (36.9 C) 98.1 F (36.7 C)  TempSrc: Oral Oral Oral Oral  SpO2: 100% 100% 97% 97%  Weight:      Height:        Intake/Output Summary (Last 24 hours) at 04/17/2020 04/19/2020 Last data filed at 04/17/2020 0507 Gross per  24 hour  Intake 2126.13 ml  Output -  Net 2126.13 ml   Filed Weights   04/16/20 0357 04/16/20 1145  Weight: 117.9 kg 115.7 kg     Data Reviewed:   CBC: Recent Labs  Lab 04/16/20 0405 04/17/20 0459  WBC 10.1 11.2*  HGB 13.3 11.6*  HCT 40.6 36.4  MCV 86.0 87.5  PLT 317 285   Basic Metabolic Panel: Recent Labs  Lab 04/16/20 0405 04/17/20 0459   NA 138 137  K 3.7 3.7  CL 106 107  CO2 23 23  GLUCOSE 113* 87  BUN 16 10  CREATININE 0.63 0.59  CALCIUM 9.0 8.4*   GFR: Estimated Creatinine Clearance: 140.1 mL/min (by C-G formula based on SCr of 0.59 mg/dL). Liver Function Tests: Recent Labs  Lab 04/16/20 0405  AST 27  ALT 34  ALKPHOS 85  BILITOT 0.5  PROT 7.2  ALBUMIN 4.2   Recent Labs  Lab 04/16/20 0405  LIPASE 689*   No results for input(s): AMMONIA in the last 168 hours. Coagulation Profile: No results for input(s): INR, PROTIME in the last 168 hours. Cardiac Enzymes: No results for input(s): CKTOTAL, CKMB, CKMBINDEX, TROPONINI in the last 168 hours. BNP (last 3 results) No results for input(s): PROBNP in the last 8760 hours. HbA1C: No results for input(s): HGBA1C in the last 72 hours. CBG: No results for input(s): GLUCAP in the last 168 hours. Lipid Profile: Recent Labs    04/16/20 0405  CHOL 150  HDL 35*  LDLCALC 101*  TRIG 68  CHOLHDL 4.3   Thyroid Function Tests: No results for input(s): TSH, T4TOTAL, FREET4, T3FREE, THYROIDAB in the last 72 hours. Anemia Panel: No results for input(s): VITAMINB12, FOLATE, FERRITIN, TIBC, IRON, RETICCTPCT in the last 72 hours. Sepsis Labs: No results for input(s): PROCALCITON, LATICACIDVEN in the last 168 hours.  Recent Results (from the past 240 hour(s))  SARS CORONAVIRUS 2 (TAT 6-24 HRS) Nasopharyngeal Nasopharyngeal Swab     Status: None   Collection Time: 04/16/20  6:23 AM   Specimen: Nasopharyngeal Swab  Result Value Ref Range Status   SARS Coronavirus 2 NEGATIVE NEGATIVE Final    Comment: (NOTE) SARS-CoV-2 target nucleic acids are NOT DETECTED.  The SARS-CoV-2 RNA is generally detectable in upper and lower respiratory specimens during the acute phase of infection. Negative results do not preclude SARS-CoV-2 infection, do not rule out co-infections with other pathogens, and should not be used as the sole basis for treatment or other patient  management decisions. Negative results must be combined with clinical observations, patient history, and epidemiological information. The expected result is Negative.  Fact Sheet for Patients: HairSlick.no  Fact Sheet for Healthcare Providers: quierodirigir.com  This test is not yet approved or cleared by the [place] FDA and  has been authorized for detection and/or diagnosis of SARS-CoV-2 by FDA under an Emergency Use Authorization (EUA). This EUA will remain  in effect (meaning this test can be used) for the duration of the COVID-19 declaration under Se ction 564(b)(1) of the Act, 21 U.S.C. section 360bbb-3(b)(1), unless the authorization is terminated or revoked sooner.  Performed at [facility], [address], [place], [place] [address]          Radiology Studies: CT ABDOMEN PELVIS W CONTRAST  Result Date: 04/16/2020 CLINICAL DATA:  Epigastric pain. EXAM: CT ABDOMEN AND PELVIS WITH CONTRAST TECHNIQUE: Multidetector CT imaging of the abdomen and pelvis was performed using the standard protocol following bolus administration of intravenous contrast.  CONTRAST:  OMNIPAQUE IOHEXOL 300 MG/ML  SOLN COMPARISON:  Ultrasound from earlier today FINDINGS: Lower chest: No acute finding.  Motion artifact. Hepatobiliary: No focal liver abnormality.Preceding right upper quadrant ultrasound. Upper common bile duct dimensions are obscured by fat edema. At the level of the head the duct measures 5 mm. No calcified choledocholithiasis. Pancreas: Generalized expansion with septal and peripancreatic edema. No necrosis or collection. No visible vascular compromise. Spleen: Unremarkable. Adrenals/Urinary Tract: Negative adrenals. No hydronephrosis or stone. Unremarkable bladder. Stomach/Bowel:  No obstruction. No appendicitis. Vascular/Lymphatic: No acute vascular abnormality. No mass or adenopathy. Reproductive:No pathologic  findings. Evidence of prior Cesarian section. Other: No ascites or pneumoperitoneum. Musculoskeletal: No acute abnormalities. IMPRESSION: Acute edematous pancreatitis without collection. Electronically Signed   By: Marnee Spring M.D.   On: 04/16/2020 06:53   US ABDOMEN LIMITED RUQ (LIVER/GB)  Result Date: 04/16/2020 CLINICAL DATA:  Epigastric pain EXAM: ULTRASOUND ABDOMEN LIMITED RIGHT UPPER QUADRANT COMPARISON:  None. FINDINGS: Gallbladder: Sludge noted layering within the fundal portion of the gallbladder. No gallstones or wall thickening visualized. No sonographic Murphy sign noted by sonographer. Common bile duct: Diameter: 6 mm Liver: Increased parenchymal echogenicity suggestive of hepatic steatosis. No focal lesion. Portal vein is patent on color Doppler imaging with normal direction of blood flow towards the liver. Other: None. IMPRESSION: 1. No acute findings. 2. Gallbladder sludge. No gallstones, gallbladder wall thickening or sonographic Murphy's sign. 3. Common bile duct is upper limits of normal in caliber measuring 6 mm. Electronically Signed   By: Signa Kell M.D.   On: 04/16/2020 05:50        Scheduled Meds: . enoxaparin (LOVENOX) injection  40 mg Subcutaneous Q24H  . pantoprazole (PROTONIX) IV  40 mg Intravenous Q24H   Continuous Infusions: . lactated ringers    . potassium chloride       LOS: 1 day   Time spent=35 mins    Sahily Biddle Joline Maxcy, MD [facility]  If 7PM-7AM, please contact night-coverage  04/17/2020, 7:52 AM

## 2020-04-17 NOTE — Plan of Care (Signed)
  Problem: Education: Goal: Knowledge of General Education information will improve Description: Including pain rating scale, medication(s)/side effects and non-pharmacologic comfort measures Outcome: Progressing   Problem: Clinical Measurements: Goal: Ability to maintain clinical measurements within normal limits will improve Outcome: Progressing Goal: Diagnostic test results will improve Outcome: Progressing   Problem: Activity: Goal: Risk for activity intolerance will decrease Outcome: Progressing   Problem: Coping: Goal: Level of anxiety will decrease Outcome: Progressing   Problem: Safety: Goal: Ability to remain free from injury will improve Outcome: Progressing   Problem: Skin Integrity: Goal: Risk for impaired skin integrity will decrease Outcome: Progressing

## 2020-04-17 NOTE — Progress Notes (Signed)
PHARMACIST - PHYSICIAN COMMUNICATION  CONCERNING:  Enoxaparin (Lovenox) for DVT Prophylaxis    RECOMMENDATION: Patient was prescribed enoxaparin 40mg  q24 hours for VTE prophylaxis.   Filed Weights   04/16/20 0357 04/16/20 1145  Weight: 117.9 kg (260 lb) 115.7 kg (255 lb)    Body mass index is 43.77 kg/m.  Estimated Creatinine Clearance: 140.1 mL/min (by C-G formula based on SCr of 0.59 mg/dL).   Based on [facility] policy patient is candidate for enoxaparin 0.5mg /kg TBW SQ every 24 hours based on BMI being >30.  DESCRIPTION: Pharmacy has adjusted enoxaparin dose per [facility] policy.  Patient is now receiving enoxaparin 57.5 mg every 24 hours   [facility] 04/17/2020 8:11 PM

## 2020-04-18 DIAGNOSIS — K859 Acute pancreatitis without necrosis or infection, unspecified: Secondary | ICD-10-CM | POA: Diagnosis not present

## 2020-04-18 LAB — CBC
HCT: 34.8 % — ABNORMAL LOW (ref 36.0–46.0)
Hemoglobin: 11.2 g/dL — ABNORMAL LOW (ref 12.0–15.0)
MCH: 28.1 pg (ref 26.0–34.0)
MCHC: 32.2 g/dL (ref 30.0–36.0)
MCV: 87.2 fL (ref 80.0–100.0)
Platelets: 263 10*3/uL (ref 150–400)
RBC: 3.99 MIL/uL (ref 3.87–5.11)
RDW: 14.2 % (ref 11.5–15.5)
WBC: 11 10*3/uL — ABNORMAL HIGH (ref 4.0–10.5)
nRBC: 0 % (ref 0.0–0.2)

## 2020-04-18 LAB — COMPREHENSIVE METABOLIC PANEL
ALT: 50 U/L — ABNORMAL HIGH (ref 0–44)
AST: 14 U/L — ABNORMAL LOW (ref 15–41)
Albumin: 3.8 g/dL (ref 3.5–5.0)
Alkaline Phosphatase: 88 U/L (ref 38–126)
Anion gap: 7 (ref 5–15)
BUN: 6 mg/dL (ref 6–20)
CO2: 26 mmol/L (ref 22–32)
Calcium: 8.9 mg/dL (ref 8.9–10.3)
Chloride: 103 mmol/L (ref 98–111)
Creatinine, Ser: 0.51 mg/dL (ref 0.44–1.00)
GFR, Estimated: >60 mL/min (ref >60)
Glucose, Bld: 89 mg/dL (ref 70–99)
Potassium: 3.9 mmol/L (ref 3.5–5.1)
Sodium: 136 mmol/L (ref 135–145)
Total Bilirubin: 0.7 mg/dL (ref 0.3–1.2)
Total Protein: 7 g/dL (ref 6.5–8.1)

## 2020-04-18 LAB — MAGNESIUM: Magnesium: 2.1 mg/dL (ref 1.7–2.4)

## 2020-04-18 LAB — LIPASE, BLOOD: Lipase: 47 U/L (ref 11–51)

## 2020-04-18 MED ORDER — OXYCODONE HCL 5 MG PO TABS: 5.0000 mg | ORAL_TABLET | ORAL | 0 refills | Status: AC | PRN

## 2020-04-18 MED ORDER — SENNOSIDES-DOCUSATE SODIUM 8.6-50 MG PO TABS: 1.0000 | ORAL_TABLET | Freq: Every evening | ORAL | 0 refills | Status: AC | PRN

## 2020-04-18 MED ORDER — LACTATED RINGERS IV BOLUS
1000.0000 mL | Freq: Once | INTRAVENOUS | Status: AC
  Administered 2020-04-18: 10:00:00 1000 mL via INTRAVENOUS

## 2020-04-18 NOTE — Discharge Summary (Signed)
Physician Discharge Summary  Alpha Chouinard VOZ:[id number] DOB: 2000/06/29 DOA: 04/16/2020  PCP: [facility]  Admit date: 04/16/2020 Discharge date: 04/18/2020  Admitted From: Home Disposition: Home  Recommendations for Outpatient Follow-up:  1. Follow up with PCP in 1-2 weeks 2. Please obtain CMP/CBC in one week your next doctors visit.  3. Advised oral hydration at home 4. Slowly advance diet at home as tolerated.   Discharge Condition: Stable CODE STATUS: Full code Diet recommendation: GI soft  Brief/Interim Summary: 20 year old female with no known past medical history comes to the hospital with complaints of acute onset of abdominal pain along with nausea vomiting.  She was diagnosed with acute noncomplicated pancreatitis with elevated lipase of 689.  CT abdomen pelvis confirmed acute pancreatitis.  Right upper quadrant ultrasound was unremarkable.  Her diet was slowly advanced and she tolerated well without any issues.  She appeared well-hydrated.  Patient today is medically stable to discharge as recommended above.   Assessment & Plan:   Principal Problem:   Acute pancreatitis Active Problems:   Obesity, Class III, BMI 40-49.9 (morbid obesity) (HCC)   Acute uncomplicated pancreatitis -Unclear etiology at this time.  Could be autoimmune.  Overall LFTs are unremarkable.  Right upper quadrant ultrasound is negative.  LDL 101, triglycerides 68 -Tolerating oral diet without any issues.  She is stable for discharge.  Lipase levels have trended down and now they are normal.  Does not have any abdominal pain.  Body mass index is 43.77 kg/m.         Discharge Diagnoses:  Principal Problem:   Acute pancreatitis Active Problems:   Obesity, Class III, BMI 40-49.9 (morbid obesity) (HCC)    Subjective: Feels great no complaints.  She wishes to go home.  Discharge Exam: Vitals:   04/18/20 0758 04/18/20 1142  BP: 105/75 (!) 91/57  Pulse: (!)  104 97  Resp: 14 15  Temp:  98.2 F (36.8 C)  SpO2: 97% 92%   Vitals:   04/18/20 0300 04/18/20 0738 04/18/20 0758 04/18/20 1142  BP: (!) 103/59 (!) 99/55 105/75 (!) 91/57  Pulse: 91 (!) 108 (!) 104 97  Resp: 17 15 14 15   Temp: 98.6 F (37 C) 99.8 F (37.7 C)  98.2 F (36.8 C)  TempSrc: Oral Oral  Oral  SpO2: 98% 97% 97% 92%  Weight:      Height:        General: Pt is alert, awake, not in acute distress Cardiovascular: RRR, S1/S2 +, no rubs, no gallops Respiratory: CTA bilaterally, no wheezing, no rhonchi Abdominal: Soft, NT, ND, bowel sounds + Extremities: no edema, no cyanosis  Discharge Instructions   Allergies as of 04/18/2020   No Known Allergies     Medication List    TAKE these medications   etonogestrel 68 MG Impl implant Commonly known as: NEXPLANON Inject 1 each into the skin daily. Inject 1 each into skin once   ibuprofen 200 MG tablet Commonly known as: ADVIL Take 200 mg by mouth every 6 (six) hours as needed for fever, headache or mild pain.   oxyCODONE 5 MG immediate release tablet Commonly known as: Oxy IR/ROXICODONE Take 1 tablet (5 mg total) by mouth every 4 (four) hours as needed for up to 3 days for moderate pain or severe pain.   pantoprazole 40 MG tablet Commonly known as: Protonix Take 1 tablet (40 mg total) by mouth daily.   senna-docusate 8.6-50 MG tablet Commonly known as: Senokot-S Take 1 tablet by  mouth at bedtime as needed for moderate constipation.       Follow-up [employer] Schedule an appointment as soon as possible for a visit in 1 week.   Why: Patient to make own follow up appt Contact information: [address] [place] [address] [phone]        Schedule an appointment as soon as possible for a visit to follow up.              No Known Allergies  You were cared for by a hospitalist during your hospital stay. If you have any questions about your discharge medications  or the care you received while you were in the hospital after you are discharged, you can call the unit and asked to speak with the hospitalist on call if the hospitalist that took care of you is not available. Once you are discharged, your primary care physician will handle any further medical issues. Please note that no refills for any discharge medications will be authorized once you are discharged, as it is imperative that you return to your primary care physician (or establish a relationship with a primary care physician if you do not have one) for your aftercare needs so that they can reassess your need for medications and monitor your lab values.   Procedures/Studies: CT ABDOMEN PELVIS W CONTRAST  Result Date: 04/16/2020 CLINICAL DATA:  Epigastric pain. EXAM: CT ABDOMEN AND PELVIS WITH CONTRAST TECHNIQUE: Multidetector CT imaging of the abdomen and pelvis was performed using the standard protocol following bolus administration of intravenous contrast. CONTRAST:  OMNIPAQUE IOHEXOL 300 MG/ML  SOLN COMPARISON:  Ultrasound from earlier today FINDINGS: Lower chest: No acute finding.  Motion artifact. Hepatobiliary: No focal liver abnormality.Preceding right upper quadrant ultrasound. Upper common bile duct dimensions are obscured by fat edema. At the level of the head the duct measures 5 mm. No calcified choledocholithiasis. Pancreas: Generalized expansion with septal and peripancreatic edema. No necrosis or collection. No visible vascular compromise. Spleen: Unremarkable. Adrenals/Urinary Tract: Negative adrenals. No hydronephrosis or stone. Unremarkable bladder. Stomach/Bowel:  No obstruction. No appendicitis. Vascular/Lymphatic: No acute vascular abnormality. No mass or adenopathy. Reproductive:No pathologic findings. Evidence of prior Cesarian section. Other: No ascites or pneumoperitoneum. Musculoskeletal: No acute abnormalities. IMPRESSION: Acute edematous pancreatitis without collection.  Electronically Signed   By: Marnee Spring M.D.   On: 04/16/2020 06:53   US ABDOMEN LIMITED RUQ (LIVER/GB)  Result Date: 04/16/2020 CLINICAL DATA:  Epigastric pain EXAM: ULTRASOUND ABDOMEN LIMITED RIGHT UPPER QUADRANT COMPARISON:  None. FINDINGS: Gallbladder: Sludge noted layering within the fundal portion of the gallbladder. No gallstones or wall thickening visualized. No sonographic Murphy sign noted by sonographer. Common bile duct: Diameter: 6 mm Liver: Increased parenchymal echogenicity suggestive of hepatic steatosis. No focal lesion. Portal vein is patent on color Doppler imaging with normal direction of blood flow towards the liver. Other: None. IMPRESSION: 1. No acute findings. 2. Gallbladder sludge. No gallstones, gallbladder wall thickening or sonographic Murphy's sign. 3. Common bile duct is upper limits of normal in caliber measuring 6 mm. Electronically Signed   By: Signa Kell M.D.   On: 04/16/2020 05:50      The results of significant diagnostics from this hospitalization (including imaging, microbiology, ancillary and laboratory) are listed below for reference.     Microbiology: Recent Results (from the past 240 hour(s))  SARS CORONAVIRUS 2 (TAT 6-24 HRS) Nasopharyngeal Nasopharyngeal Swab     Status: None   Collection Time:  04/16/20  6:23 AM   Specimen: Nasopharyngeal Swab  Result Value Ref Range Status   SARS Coronavirus 2 NEGATIVE NEGATIVE Final    Comment: (NOTE) SARS-CoV-2 target nucleic acids are NOT DETECTED.  The SARS-CoV-2 RNA is generally detectable in upper and lower respiratory specimens during the acute phase of infection. Negative results do not preclude SARS-CoV-2 infection, do not rule out co-infections with other pathogens, and should not be used as the sole basis for treatment or other patient management decisions. Negative results must be combined with clinical observations, patient history, and epidemiological information. The expected result is  Negative.  Fact Sheet for Patients: HairSlick.nohttps://www.fda.gov/media/138098/download  Fact Sheet for Healthcare Providers: quierodirigir.comhttps://www.fda.gov/media/138095/download  This test is not yet approved or cleared by the [place] FDA and  has been authorized for detection and/or diagnosis of SARS-CoV-2 by FDA under an Emergency Use Authorization (EUA). This EUA will remain  in effect (meaning this test can be used) for the duration of the COVID-19 declaration under Se ction 564(b)(1) of the Act, 21 U.S.C. section 360bbb-3(b)(1), unless the authorization is terminated or revoked sooner.  Performed at [facility], [address] [place], [place] [id number]      Labs: BNP (last 3 results) No results for input(s): BNP in the last 8760 hours. Basic Metabolic Panel: Recent Labs  Lab 04/16/20 0405 04/17/20 0459 04/18/20 0425  NA 138 137 136  K 3.7 3.7 3.9  CL 106 107 103  CO2 23 23 26   GLUCOSE 113* 87 89  BUN 16 10 6   CREATININE 0.63 0.59 0.51  CALCIUM 9.0 8.4* 8.9  MG  --   --  2.1   Liver Function Tests: Recent Labs  Lab 04/16/20 0405 04/18/20 0425  AST 27 14*  ALT 34 50*  ALKPHOS 85 88  BILITOT 0.5 0.7  PROT 7.2 7.0  ALBUMIN 4.2 3.8   Recent Labs  Lab 04/16/20 0405 04/18/20 0425  LIPASE 689* 47   No results for input(s): AMMONIA in the last 168 hours. CBC: Recent Labs  Lab 04/16/20 0405 04/17/20 0459 04/18/20 0425  WBC 10.1 11.2* 11.0*  HGB 13.3 11.6* 11.2*  HCT 40.6 36.4 34.8*  MCV 86.0 87.5 87.2  PLT 317 285 263   Cardiac Enzymes: No results for input(s): CKTOTAL, CKMB, CKMBINDEX, TROPONINI in the last 168 hours. BNP: Invalid input(s): POCBNP CBG: No results for input(s): GLUCAP in the last 168 hours. D-Dimer No results for input(s): DDIMER in the last 72 hours. Hgb A1c No results for input(s): HGBA1C in the last 72 hours. Lipid Profile Recent Labs    04/16/20 0405  CHOL 150  HDL 35*  LDLCALC 101*  TRIG 68  CHOLHDL 4.3   Thyroid  function studies No results for input(s): TSH, T4TOTAL, T3FREE, THYROIDAB in the last 72 hours.  Invalid input(s): FREET3 Anemia work up No results for input(s): VITAMINB12, FOLATE, FERRITIN, TIBC, IRON, RETICCTPCT in the last 72 hours. Urinalysis    Component Value Date/Time   COLORURINE YELLOW (A) 04/16/2020 0612   APPEARANCEUR HAZY (A) 04/16/2020 0612   LABSPEC 1.020 04/16/2020 0612   PHURINE 6.0 04/16/2020 0612   GLUCOSEU NEGATIVE 04/16/2020 0612   HGBUR SMALL (A) 04/16/2020 0612   BILIRUBINUR NEGATIVE 04/16/2020 0612   KETONESUR NEGATIVE 04/16/2020 0612   PROTEINUR NEGATIVE 04/16/2020 0612   NITRITE NEGATIVE 04/16/2020 0612   LEUKOCYTESUR NEGATIVE 04/16/2020 0612   Sepsis Labs Invalid input(s): PROCALCITONIN,  WBC,  LACTICIDVEN Microbiology Recent Results (from the past 240 hour(s))  SARS CORONAVIRUS 2 (TAT 6-24 HRS) Nasopharyngeal Nasopharyngeal Swab     Status: None   Collection Time: 04/16/20  6:23 AM   Specimen: Nasopharyngeal Swab  Result Value Ref Range Status   SARS Coronavirus 2 NEGATIVE NEGATIVE Final    Comment: (NOTE) SARS-CoV-2 target nucleic acids are NOT DETECTED.  The SARS-CoV-2 RNA is generally detectable in upper and lower respiratory specimens during the acute phase of infection. Negative results do not preclude SARS-CoV-2 infection, do not rule out co-infections with other pathogens, and should not be used as the sole basis for treatment or other patient management decisions. Negative results must be combined with clinical observations, patient history, and epidemiological information. The expected result is Negative.  Fact Sheet for Patients: HairSlick.no  Fact Sheet for Healthcare Providers: quierodirigir.com  This test is not yet approved or cleared by the [place] FDA and  has been authorized for detection and/or diagnosis of SARS-CoV-2 by FDA under an Emergency Use Authorization  (EUA). This EUA will remain  in effect (meaning this test can be used) for the duration of the COVID-19 declaration under Se ction 564(b)(1) of the Act, 21 U.S.C. section 360bbb-3(b)(1), unless the authorization is terminated or revoked sooner.  Performed at [facility], [address] [place], [place] [address]      Time coordinating discharge:  I have spent 35 minutes face to face with the patient and on the ward discussing the patients care, assessment, plan and disposition with other care givers. >50% of the time was devoted counseling the patient about the risks and benefits of treatment/Discharge disposition and coordinating care.   SIGNED:   Dimple Nanas, MD  [facility] 04/18/2020, 12:39 PM   If 7PM-7AM, please contact night-coverage

## 2020-12-17 ENCOUNTER — Emergency Department
Admission: EM | Admit: 2020-12-17 | Discharge: 2020-12-17 | Discharge status: Against Medical Advice | Payer: Medicaid Other | Attending: Emergency Medicine | Admitting: Emergency Medicine

## 2020-12-17 ENCOUNTER — Other Ambulatory Visit: Payer: Self-pay

## 2020-12-17 DIAGNOSIS — R1110 Vomiting, unspecified: Secondary | ICD-10-CM | POA: Diagnosis not present

## 2020-12-17 DIAGNOSIS — Z5321 Procedure and treatment not carried out due to patient leaving prior to being seen by health care provider: Secondary | ICD-10-CM | POA: Insufficient documentation

## 2020-12-17 LAB — CBC
HCT: 40.4 % (ref 36.0–46.0)
Hemoglobin: 13.7 g/dL (ref 12.0–15.0)
MCH: 30.1 pg (ref 26.0–34.0)
MCHC: 33.9 g/dL (ref 30.0–36.0)
MCV: 88.8 fL (ref 80.0–100.0)
Platelets: 333 10*3/uL (ref 150–400)
RBC: 4.55 MIL/uL (ref 3.87–5.11)
RDW: 12.1 % (ref 11.5–15.5)
WBC: 15.9 10*3/uL — ABNORMAL HIGH (ref 4.0–10.5)
nRBC: 0 % (ref 0.0–0.2)

## 2020-12-17 LAB — COMPREHENSIVE METABOLIC PANEL
ALT: 30 U/L (ref 0–44)
AST: 18 U/L (ref 15–41)
Albumin: 4.2 g/dL (ref 3.5–5.0)
Alkaline Phosphatase: 96 U/L (ref 38–126)
Anion gap: 12 (ref 5–15)
BUN: 15 mg/dL (ref 6–20)
CO2: 22 mmol/L (ref 22–32)
Calcium: 9.1 mg/dL (ref 8.9–10.3)
Chloride: 105 mmol/L (ref 98–111)
Creatinine, Ser: 0.54 mg/dL (ref 0.44–1.00)
GFR, Estimated: >60 mL/min (ref >60)
Glucose, Bld: 130 mg/dL — ABNORMAL HIGH (ref 70–99)
Potassium: 3.2 mmol/L — ABNORMAL LOW (ref 3.5–5.1)
Sodium: 139 mmol/L (ref 135–145)
Total Bilirubin: 0.5 mg/dL (ref 0.3–1.2)
Total Protein: 8 g/dL (ref 6.5–8.1)

## 2020-12-17 LAB — LIPASE, BLOOD: Lipase: 24 U/L (ref 11–51)

## 2020-12-17 MED ORDER — ONDANSETRON 4 MG PO TBDP
4.0000 mg | ORAL_TABLET | Freq: Once | ORAL | Status: AC | PRN
  Administered 2020-12-17: 4 mg via ORAL
  Filled 2020-12-17: qty 1

## 2020-12-17 NOTE — ED Notes (Signed)
Seen by registration staff pt leave

## 2020-12-17 NOTE — ED Triage Notes (Signed)
Pt arrived via POV with family reports of drinking beer and liquor states unk amt, c/o vomiting and abd pain. Vomiting noted in ED lobby.

## 2022-01-05 IMAGING — US US ABDOMEN LIMITED RUQ/ASCITES
1 series · 14 of 25 positions shown · non-contrast
Comparison: None.

CLINICAL DATA: Epigastric pain

EXAM:
ULTRASOUND ABDOMEN LIMITED RIGHT UPPER QUADRANT

[Series 1: us abdomen limited ruq (liver/gb) · 14 of 45 slices shown]
[im 1/45]
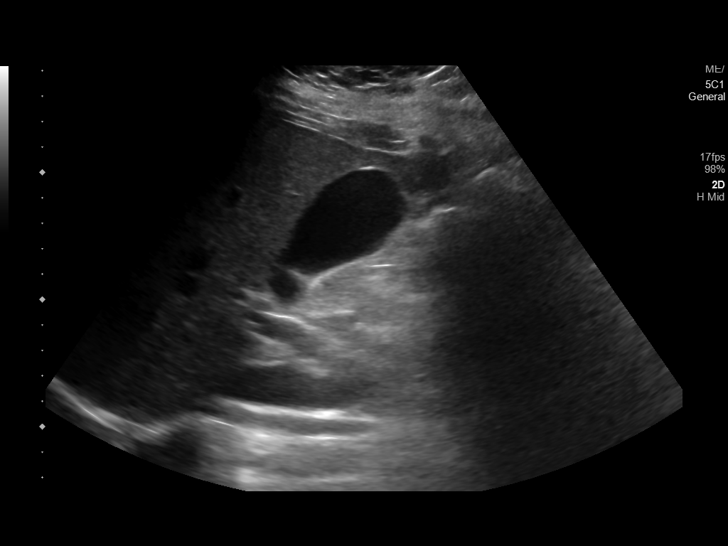
[im 4/45]
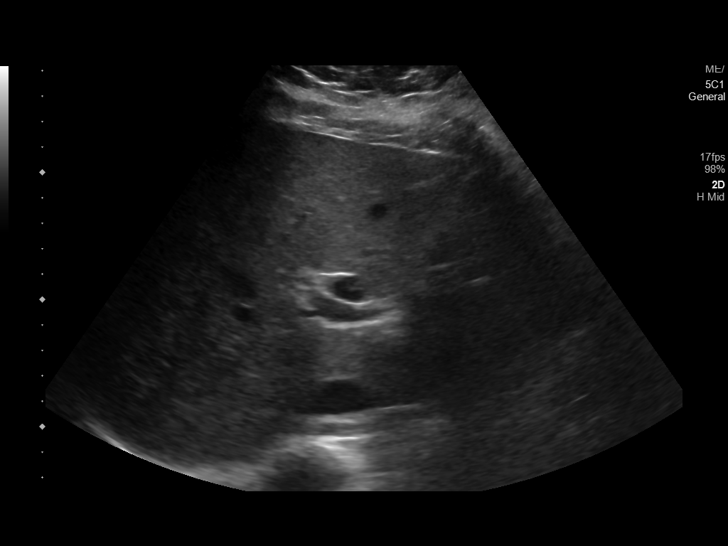
[im 8/45]
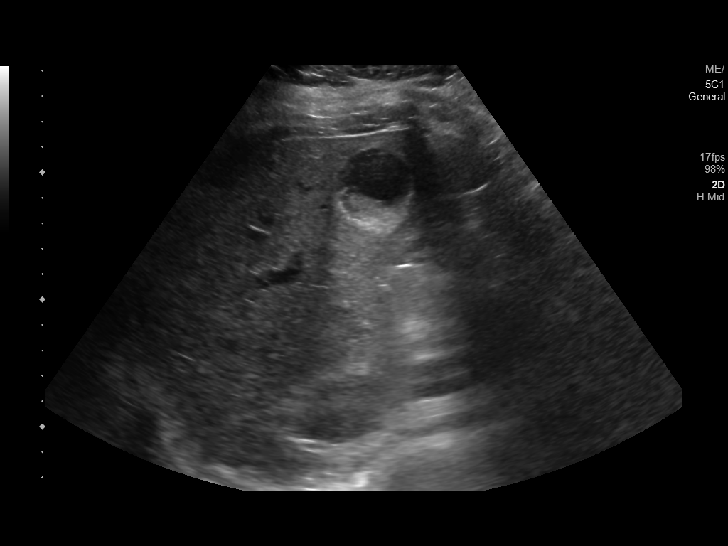
[im 12/45]
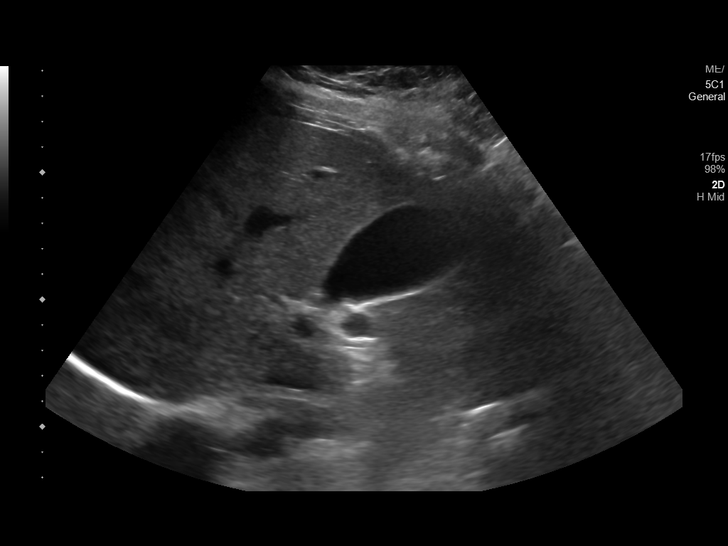
[im 15/45]
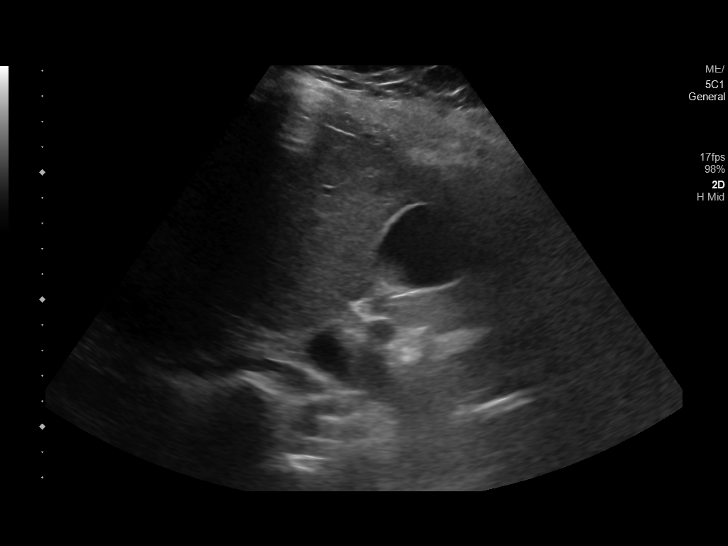
[im 17/45]
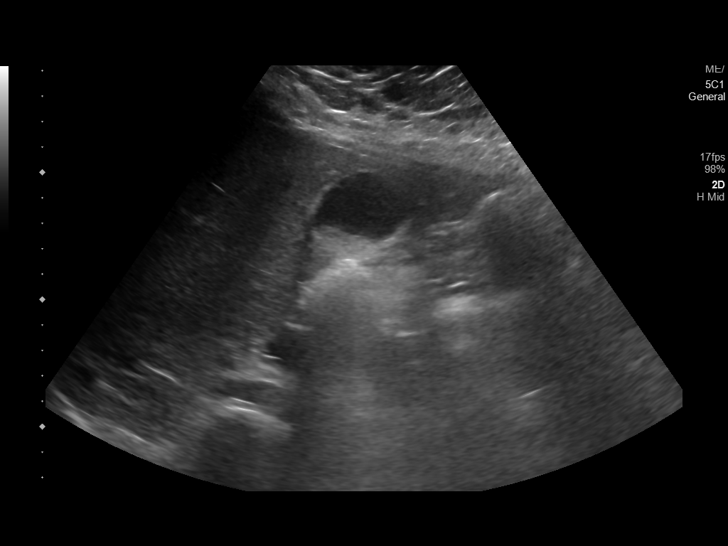
[im 21/45]
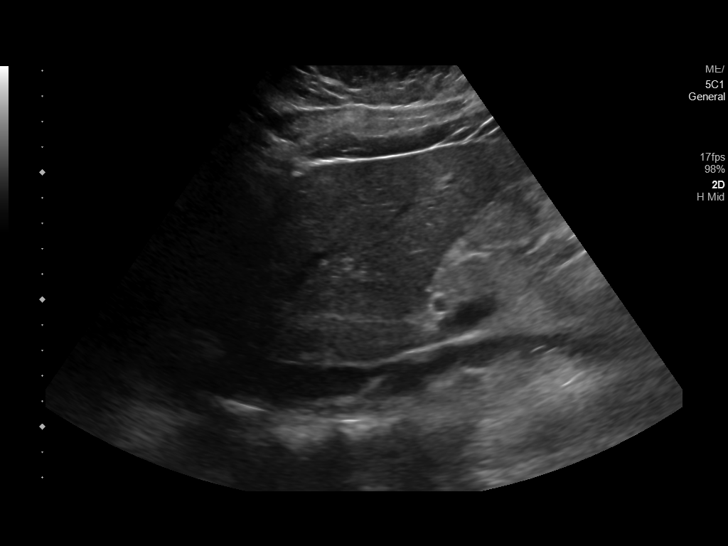
[im 24/45]
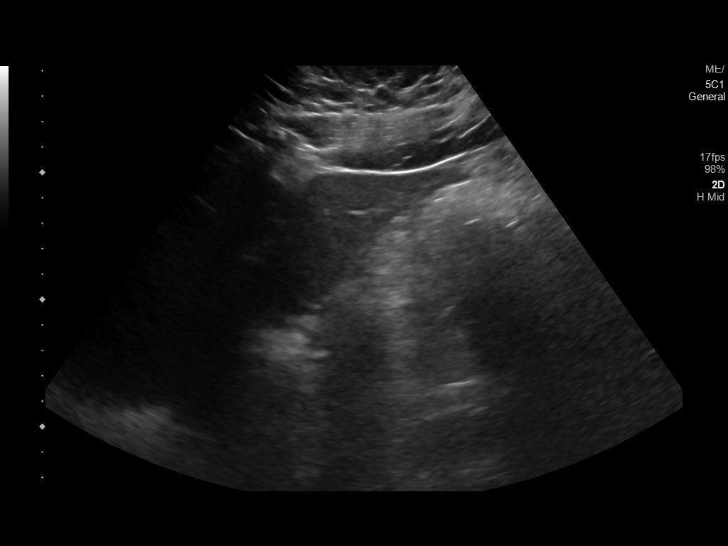
[im 28/45]
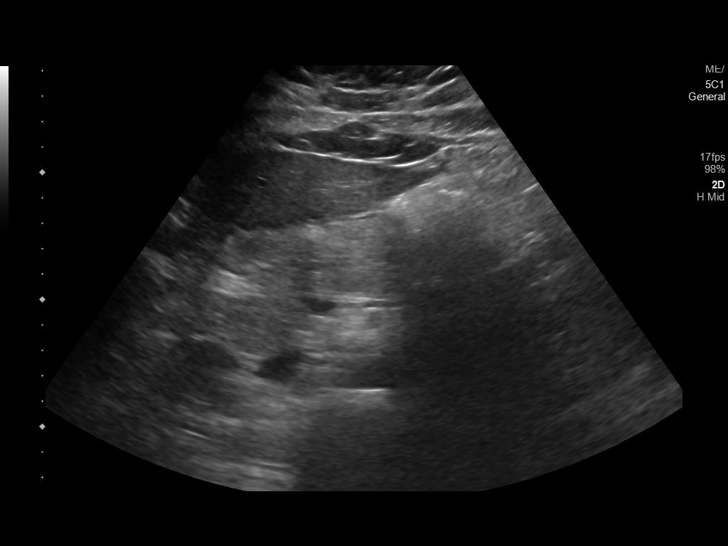
[im 30/45]
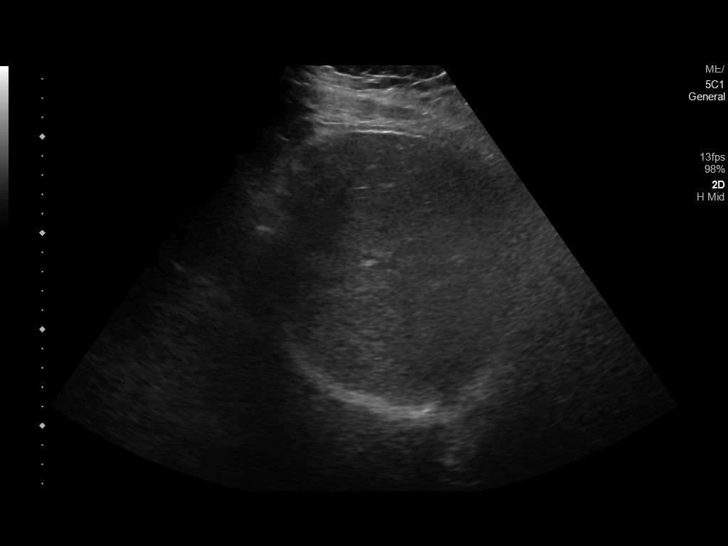
[im 34/45]
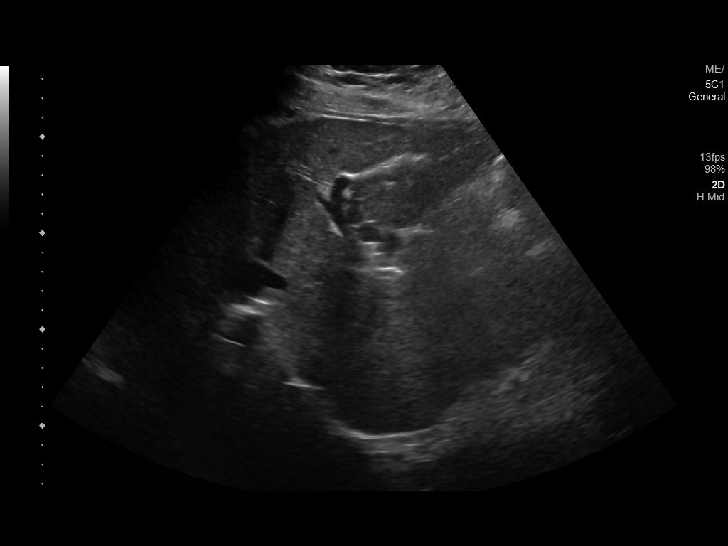
[im 37/45]
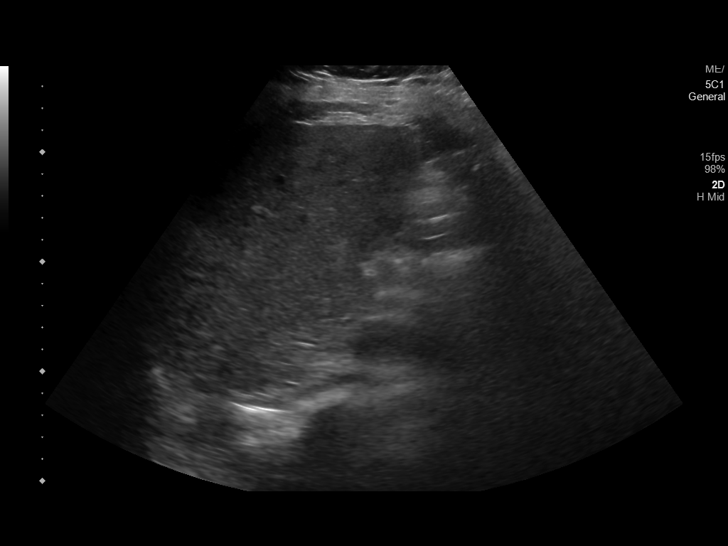
[im 41/45]
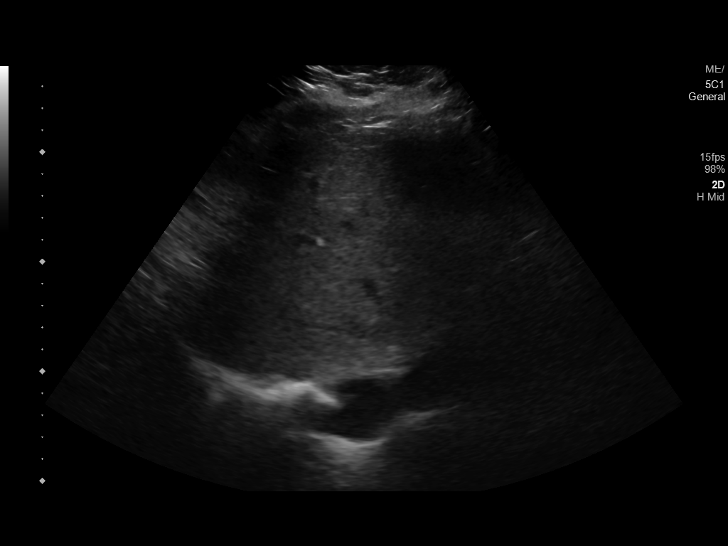
[im 45/45]
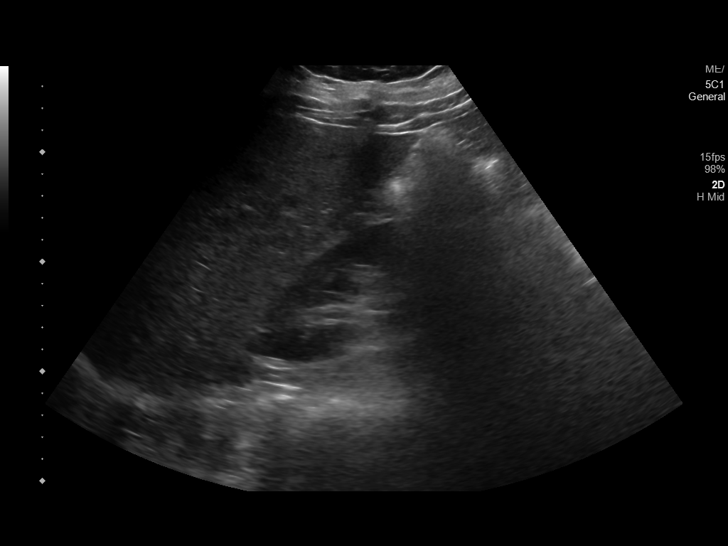

[14 of 25 positions shown; findings below may reference images not displayed]

FINDINGS: Gallbladder:

Sludge noted layering within the fundal portion of the gallbladder.
No gallstones or wall thickening visualized. No sonographic Murphy
sign noted by sonographer.

Common bile duct:

Diameter: 6 mm

Liver:

Increased parenchymal echogenicity suggestive of hepatic steatosis.
No focal lesion. Portal vein is patent on color Doppler imaging with
normal direction of blood flow towards the liver.

Other: None.
IMPRESSION: 1. No acute findings.
2. Gallbladder sludge. No gallstones, gallbladder wall thickening or
sonographic Murphy's sign.
3. Common bile duct is upper limits of normal in caliber measuring 6
mm.
